# Patient Record
Sex: Female | Born: 1950 | Race: White | Hispanic: No | Marital: Married | Smoking: Never smoker
Health system: Southern US, Community
[De-identification: ages and names within clinical notes are randomized; demographics above are authoritative.]

## PROBLEM LIST (undated history)

## (undated) DIAGNOSIS — M1990 Unspecified osteoarthritis, unspecified site: Secondary | ICD-10-CM

## (undated) DIAGNOSIS — I10 Essential (primary) hypertension: Secondary | ICD-10-CM

---

## 2003-09-04 ENCOUNTER — Other Ambulatory Visit: Payer: Self-pay

## 2005-07-14 ENCOUNTER — Ambulatory Visit: Payer: Self-pay | Admitting: Family Medicine

## 2008-05-08 ENCOUNTER — Ambulatory Visit: Payer: Self-pay | Admitting: Family Medicine

## 2009-06-28 ENCOUNTER — Ambulatory Visit: Payer: Self-pay

## 2010-10-18 ENCOUNTER — Ambulatory Visit: Payer: Self-pay | Admitting: Family Medicine

## 2010-10-19 ENCOUNTER — Ambulatory Visit: Payer: Self-pay | Admitting: Obstetrics and Gynecology

## 2011-01-12 ENCOUNTER — Ambulatory Visit: Payer: Self-pay

## 2012-04-03 ENCOUNTER — Ambulatory Visit: Payer: Self-pay | Admitting: Obstetrics and Gynecology

## 2014-06-18 ENCOUNTER — Ambulatory Visit: Payer: Self-pay | Admitting: Family Medicine

## 2014-06-23 ENCOUNTER — Ambulatory Visit: Payer: Self-pay | Admitting: Family Medicine

## 2016-08-29 ENCOUNTER — Other Ambulatory Visit: Payer: Self-pay | Admitting: Obstetrics and Gynecology

## 2016-08-29 DIAGNOSIS — Z13820 Encounter for screening for osteoporosis: Secondary | ICD-10-CM

## 2016-08-29 DIAGNOSIS — Z1231 Encounter for screening mammogram for malignant neoplasm of breast: Secondary | ICD-10-CM | POA: Diagnosis not present

## 2016-08-29 DIAGNOSIS — Z6836 Body mass index (BMI) 36.0-36.9, adult: Secondary | ICD-10-CM | POA: Diagnosis not present

## 2016-08-29 DIAGNOSIS — Z01419 Encounter for gynecological examination (general) (routine) without abnormal findings: Secondary | ICD-10-CM | POA: Diagnosis not present

## 2016-08-29 DIAGNOSIS — Z1239 Encounter for other screening for malignant neoplasm of breast: Secondary | ICD-10-CM

## 2016-09-13 ENCOUNTER — Ambulatory Visit
Admission: RE | Admit: 2016-09-13 | Discharge: 2016-09-13 | Disposition: A | Discharge status: Home / Self Care | Payer: PPO | Source: Ambulatory Visit | Attending: Obstetrics and Gynecology | Admitting: Obstetrics and Gynecology

## 2016-09-13 ENCOUNTER — Encounter: Payer: Self-pay | Admitting: Radiology

## 2016-09-13 DIAGNOSIS — M85852 Other specified disorders of bone density and structure, left thigh: Secondary | ICD-10-CM | POA: Diagnosis not present

## 2016-09-13 DIAGNOSIS — Z1239 Encounter for other screening for malignant neoplasm of breast: Secondary | ICD-10-CM

## 2016-09-13 DIAGNOSIS — Z13820 Encounter for screening for osteoporosis: Secondary | ICD-10-CM | POA: Insufficient documentation

## 2016-09-13 DIAGNOSIS — Z1231 Encounter for screening mammogram for malignant neoplasm of breast: Secondary | ICD-10-CM | POA: Diagnosis not present

## 2016-11-01 ENCOUNTER — Other Ambulatory Visit: Payer: Self-pay

## 2016-11-01 NOTE — Patient Outreach (Signed)
[employer] Questionnaire: 10/31/2016  Placed call to patient, no answer. Unidentified voicemail. No message left. 11/01/2016  Placed 2nd call to patient. No answer. Unidentified voicemail mail. No message left.   PLAN: will attempt outreach again in 1 week. Rowe PavyAmanda Shanel Prazak, RN, BSN, CEN [facility] Coordinator [phone]

## 2016-11-08 ENCOUNTER — Other Ambulatory Visit: Payer: Self-pay

## 2016-11-08 NOTE — Patient Outreach (Signed)
[employer] Screening:  Attempted to reach patient for follow up on questionnaire: no answer.  PLAN: Will mail unsuccessful outreach letter and wait 10 days for a response. If no response will close.  Rowe PavyAmanda Jaliah Foody, RN, BSN, CEN [facility] Coordinator [phone]

## 2016-11-09 ENCOUNTER — Ambulatory Visit: Payer: Self-pay

## 2016-11-20 ENCOUNTER — Other Ambulatory Visit: Payer: Self-pay

## 2016-11-20 NOTE — Patient Outreach (Signed)
Case closure:  No response to outreach calls or letters.  Case closed.   Rowe PavyAmanda Ruthanne Mcneish, RN, BSN, CEN [facility] Coordinator [phone]

## 2016-11-22 ENCOUNTER — Ambulatory Visit: Payer: Self-pay

## 2017-01-15 DIAGNOSIS — G43909 Migraine, unspecified, not intractable, without status migrainosus: Secondary | ICD-10-CM | POA: Diagnosis not present

## 2017-01-15 DIAGNOSIS — Z23 Encounter for immunization: Secondary | ICD-10-CM | POA: Diagnosis not present

## 2017-01-15 DIAGNOSIS — Z0001 Encounter for general adult medical examination with abnormal findings: Secondary | ICD-10-CM | POA: Diagnosis not present

## 2017-01-15 DIAGNOSIS — F419 Anxiety disorder, unspecified: Secondary | ICD-10-CM | POA: Diagnosis not present

## 2017-01-15 DIAGNOSIS — N951 Menopausal and female climacteric states: Secondary | ICD-10-CM | POA: Diagnosis not present

## 2017-01-15 DIAGNOSIS — I10 Essential (primary) hypertension: Secondary | ICD-10-CM | POA: Diagnosis not present

## 2017-01-15 DIAGNOSIS — G894 Chronic pain syndrome: Secondary | ICD-10-CM | POA: Diagnosis not present

## 2017-01-15 DIAGNOSIS — Z7982 Long term (current) use of aspirin: Secondary | ICD-10-CM | POA: Diagnosis not present

## 2017-02-26 DIAGNOSIS — R05 Cough: Secondary | ICD-10-CM | POA: Diagnosis not present

## 2017-02-26 DIAGNOSIS — Z6835 Body mass index (BMI) 35.0-35.9, adult: Secondary | ICD-10-CM | POA: Diagnosis not present

## 2017-04-11 DIAGNOSIS — G43909 Migraine, unspecified, not intractable, without status migrainosus: Secondary | ICD-10-CM | POA: Diagnosis not present

## 2017-04-11 DIAGNOSIS — I10 Essential (primary) hypertension: Secondary | ICD-10-CM | POA: Diagnosis not present

## 2017-04-11 DIAGNOSIS — Z79899 Other long term (current) drug therapy: Secondary | ICD-10-CM | POA: Diagnosis not present

## 2017-04-11 DIAGNOSIS — M67441 Ganglion, right hand: Secondary | ICD-10-CM | POA: Diagnosis not present

## 2017-04-11 DIAGNOSIS — G894 Chronic pain syndrome: Secondary | ICD-10-CM | POA: Diagnosis not present

## 2017-07-11 DIAGNOSIS — I10 Essential (primary) hypertension: Secondary | ICD-10-CM | POA: Diagnosis not present

## 2017-07-11 DIAGNOSIS — G894 Chronic pain syndrome: Secondary | ICD-10-CM | POA: Diagnosis not present

## 2017-10-18 DIAGNOSIS — G894 Chronic pain syndrome: Secondary | ICD-10-CM | POA: Diagnosis not present

## 2017-10-18 DIAGNOSIS — G43909 Migraine, unspecified, not intractable, without status migrainosus: Secondary | ICD-10-CM | POA: Diagnosis not present

## 2017-10-18 DIAGNOSIS — Z6834 Body mass index (BMI) 34.0-34.9, adult: Secondary | ICD-10-CM | POA: Diagnosis not present

## 2017-10-18 DIAGNOSIS — I10 Essential (primary) hypertension: Secondary | ICD-10-CM | POA: Diagnosis not present

## 2017-10-18 DIAGNOSIS — Z79891 Long term (current) use of opiate analgesic: Secondary | ICD-10-CM | POA: Diagnosis not present

## 2017-10-18 DIAGNOSIS — Z79899 Other long term (current) drug therapy: Secondary | ICD-10-CM | POA: Diagnosis not present

## 2017-10-18 DIAGNOSIS — E876 Hypokalemia: Secondary | ICD-10-CM | POA: Diagnosis not present

## 2017-12-24 ENCOUNTER — Other Ambulatory Visit: Payer: Self-pay

## 2017-12-24 ENCOUNTER — Ambulatory Visit
Admission: EM | Admit: 2017-12-24 | Discharge: 2017-12-24 | Disposition: A | Discharge status: Home / Self Care | Payer: PPO | Attending: Family Medicine | Admitting: Family Medicine

## 2017-12-24 ENCOUNTER — Encounter: Payer: Self-pay | Admitting: Emergency Medicine

## 2017-12-24 DIAGNOSIS — T464X5A Adverse effect of angiotensin-converting-enzyme inhibitors, initial encounter: Secondary | ICD-10-CM

## 2017-12-24 DIAGNOSIS — T783XXA Angioneurotic edema, initial encounter: Secondary | ICD-10-CM

## 2017-12-24 DIAGNOSIS — R220 Localized swelling, mass and lump, head: Secondary | ICD-10-CM | POA: Diagnosis not present

## 2017-12-24 DIAGNOSIS — Z79899 Other long term (current) drug therapy: Secondary | ICD-10-CM | POA: Diagnosis not present

## 2017-12-24 DIAGNOSIS — J029 Acute pharyngitis, unspecified: Secondary | ICD-10-CM | POA: Diagnosis not present

## 2017-12-24 DIAGNOSIS — I10 Essential (primary) hypertension: Secondary | ICD-10-CM | POA: Diagnosis not present

## 2017-12-24 HISTORY — DX: Essential (primary) hypertension: I10

## 2017-12-24 HISTORY — DX: Unspecified osteoarthritis, unspecified site: M19.90

## 2017-12-24 LAB — RAPID STREP SCREEN (MED CTR MEBANE ONLY): Streptococcus, Group A Screen (Direct): NEGATIVE

## 2017-12-24 NOTE — ED Provider Notes (Addendum)
[facility] ____________________________________________  Time seen: Approximately 3:50 PM  I have reviewed the triage vital signs and the nursing notes.   HISTORY  Chief Complaint Sore Throat   HPI Marissa Ramirez is a 67 y.o. female presenting for evaluation of what she initially describes as a sore throat.  Patient further describes sore throat as throat swelling sensation only on the right side.  States this started approximately 2 hours prior to arrival.  Denies any accompanying cough, congestion, fevers, rash.  Able to swallow normally per patient.  Denies lip or tongue swelling or facial swelling.  Patient however then reports last Monday significant swelling to upper lip but thought it was due to a new make-up.  However then she had a similar on Thursday that was only to her lower lip but not as bad.  States that she has not use the make-up again.  Denies any changes in foods, medicines, lotions, detergents or other contacts.  States otherwise she feels completely fine.  Denies chest pain, shortness of breath, skin changes or other swelling sensation.  Denies any eating just prior to symptom onset.  Denies aggravating or alleviating factors for today.  Did take Benadryl last Monday.  Dortha Kern, MD: PCP   Past Medical History:  Diagnosis Date  . Arthritis   . Hypertension     There are no active problems to display for this patient.   History reviewed. No pertinent surgical history.   No current facility-administered medications for this encounter.   Current Outpatient Medications:  .  amLODipine (NORVASC) 5 MG tablet, Take 5 mg by mouth daily., Disp: , Rfl: 3 .  aspirin 81 MG chewable tablet, Baby Aspirin, Disp: , Rfl:  .  celecoxib (CELEBREX) 200 MG capsule, Celebrex 200 mg capsule   1 capsule every day by oral route., Disp: , Rfl:  .  chlorthalidone (HYGROTON) 25 MG tablet, chlorthalidone 25 mg tablet   1 tablet every day by oral route., Disp: , Rfl:    .  cholecalciferol (VITAMIN D) 1000 units tablet, Vitamin D, Disp: , Rfl:  .  citalopram (CELEXA) 20 MG tablet, citalopram 20 mg tablet   1 tablet every day by oral route., Disp: , Rfl:  .  estrogen, conjugated,-medroxyprogesterone (PREMPRO) 0.3-1.5 MG tablet, Prempro 0.3 mg-1.5 mg tablet  Take 1 tablet every day by oral route., Disp: , Rfl:  .  metoprolol tartrate (LOPRESSOR) 25 MG tablet, metoprolol tartrate 25 mg tablet   1 tablet every day by oral route., Disp: , Rfl:  .  quinapril (ACCUPRIL) 40 MG tablet, Take 40 mg by mouth daily., Disp: , Rfl: 3 .  traMADol (ULTRAM) 50 MG tablet, tramadol 50 mg tablet   2 tablets every day by oral route., Disp: , Rfl:   Allergies Patient has no known allergies.  Family History  Problem Relation Age of Onset  . Breast cancer Maternal Aunt 70  . Hypertension Father     Social History Social History   Tobacco Use  . Smoking status: Never Smoker  . Smokeless tobacco: Never Used  Substance Use Topics  . Alcohol use: Not Currently  . Drug use: Never    Review of Systems Constitutional: No fever/chills Eyes: No visual changes. ENT: As above.  Cardiovascular: Denies chest pain. Respiratory: Denies shortness of breath. Gastrointestinal: No abdominal pain.  Skin: Negative for rash. Neurological: Negative for headaches, focal weakness or numbness.    ____________________________________________   PHYSICAL EXAM:  VITAL SIGNS: ED Triage Vitals [  12/24/17 1511]  Enc Vitals Group     BP (!) 135/97     Pulse Rate 85     Resp 16     Temp 98.5 F (36.9 C)     Temp Source Oral     SpO2 97 %     Weight 210 lb (95.3 kg)     Height 5\' 5"  (1.651 m)     Head Circumference      Peak Flow      Pain Score 5     Pain Loc      Pain Edu?      Excl. in GC?     Constitutional: Alert and oriented. Well appearing and in no acute distress. Eyes: Conjunctivae are normal.  ENT      Head: Normocephalic and atraumatic.  No facial edema noted.       Ears: Nontender, no erythema, normal TMs bilaterally.      Nose: No congestion/rhinnorhea.      Mouth/Throat: Mucous membranes are moist.Oropharynx non-erythematous.  Right posterior soft palate and pharyngeal mild unilateral edema noted, no erythema, no lesions.  No edema to lip or tongue. Neck: No stridor. Supple without meningismus.  Hematological/Lymphatic/Immunilogical: No cervical lymphadenopathy. Cardiovascular: Normal rate, regular rhythm. Grossly normal heart sounds.  Good peripheral circulation. Respiratory: Normal respiratory effort without tachypnea nor retractions. Breath sounds are clear and equal bilaterally. No wheezes, rales, rhonchi. Musculoskeletal: Steady gait.  No extremity edema noted. Neurologic:  Normal speech and language. Speech is normal. No gait instability.  Skin:  Skin is warm, dry and intact. No rash noted. Psychiatric: Mood and affect are normal. Speech and behavior are normal. Patient exhibits appropriate insight and judgment     Above picture from patient of last Mondays initial lip swelling. ___________________________________________   LABS (all labs ordered are listed, but only abnormal results are displayed)  Labs Reviewed  RAPID STREP SCREEN ([facility] ONLY)  CULTURE, GROUP A STREP [facility])    PROCEDURES Procedures    INITIAL IMPRESSION / ASSESSMENT AND PLAN / ED COURSE  Pertinent labs & imaging results that were available during my care of the patient were reviewed by me and considered in my medical decision making (see chart for details).  Well-appearing patient.  No acute distress.  Quick strep negative, will culture.  Presented for evaluation of throat swelling sensation.  Mild posterior edema noted.  No changes in foods or medicines.  Patient does currently take ACE inhibitor and she did take this morning.  Suspect angioedema from ACE inhibitor.  Patient denies any worsening of complaints since onset.  Due to location of edema,  recommend further evaluation and observation in emergency room at this time.  Patient agreed to this plan and states that she will have her husband take her to [facility]  Discussed no longer taking ACE inhibitor and to consider allergy.  Discussed strict follow-up and return parameters.  Patient agrees with this plan.  Discussed patient plan of care with Dr. Adriana Simas who also saw patient agrees with plan. ____________________________________________   FINAL CLINICAL IMPRESSION(S) / ED DIAGNOSES  Final diagnoses:  Angioedema, initial encounter  Angioedema due to angiotensin converting enzyme inhibitor (ACE-I)     ED Discharge Orders    None       Note: This dictation was prepared with Dragon dictation along with smaller phrase technology. Any transcriptional errors that result from this process are unintentional.         Renford Dills, NP 12/24/17 1756  Renford Dills, NP 12/24/17 1757

## 2017-12-24 NOTE — Discharge Instructions (Signed)
STOP ACE inhibitor quinapril.   Go directly to emergency room as discussed.

## 2017-12-24 NOTE — ED Triage Notes (Signed)
Patient c/o sore throat that started this morning. She stated she feels like something is stuck in her throat.

## 2017-12-27 LAB — CULTURE, GROUP A STREP (THRC)

## 2018-01-10 DIAGNOSIS — I10 Essential (primary) hypertension: Secondary | ICD-10-CM | POA: Diagnosis not present

## 2018-01-10 DIAGNOSIS — F419 Anxiety disorder, unspecified: Secondary | ICD-10-CM | POA: Diagnosis not present

## 2018-01-10 DIAGNOSIS — T783XXA Angioneurotic edema, initial encounter: Secondary | ICD-10-CM | POA: Diagnosis not present

## 2018-01-10 DIAGNOSIS — Z1389 Encounter for screening for other disorder: Secondary | ICD-10-CM | POA: Diagnosis not present

## 2018-01-10 DIAGNOSIS — Z6834 Body mass index (BMI) 34.0-34.9, adult: Secondary | ICD-10-CM | POA: Diagnosis not present

## 2018-01-10 DIAGNOSIS — Z1331 Encounter for screening for depression: Secondary | ICD-10-CM | POA: Diagnosis not present

## 2018-01-10 DIAGNOSIS — G894 Chronic pain syndrome: Secondary | ICD-10-CM | POA: Diagnosis not present

## 2018-01-14 DIAGNOSIS — Z6834 Body mass index (BMI) 34.0-34.9, adult: Secondary | ICD-10-CM | POA: Diagnosis not present

## 2018-01-14 DIAGNOSIS — I10 Essential (primary) hypertension: Secondary | ICD-10-CM | POA: Diagnosis not present

## 2018-01-14 DIAGNOSIS — J069 Acute upper respiratory infection, unspecified: Secondary | ICD-10-CM | POA: Diagnosis not present

## 2018-01-17 DIAGNOSIS — Z6834 Body mass index (BMI) 34.0-34.9, adult: Secondary | ICD-10-CM | POA: Diagnosis not present

## 2018-01-17 DIAGNOSIS — R05 Cough: Secondary | ICD-10-CM | POA: Diagnosis not present

## 2018-04-04 DIAGNOSIS — F419 Anxiety disorder, unspecified: Secondary | ICD-10-CM | POA: Diagnosis not present

## 2018-04-04 DIAGNOSIS — Z6834 Body mass index (BMI) 34.0-34.9, adult: Secondary | ICD-10-CM | POA: Diagnosis not present

## 2018-04-04 DIAGNOSIS — G894 Chronic pain syndrome: Secondary | ICD-10-CM | POA: Diagnosis not present

## 2018-04-04 DIAGNOSIS — Z0001 Encounter for general adult medical examination with abnormal findings: Secondary | ICD-10-CM | POA: Diagnosis not present

## 2018-04-04 DIAGNOSIS — Z791 Long term (current) use of non-steroidal anti-inflammatories (NSAID): Secondary | ICD-10-CM | POA: Diagnosis not present

## 2018-04-04 DIAGNOSIS — L209 Atopic dermatitis, unspecified: Secondary | ICD-10-CM | POA: Diagnosis not present

## 2018-04-04 DIAGNOSIS — Z13220 Encounter for screening for lipoid disorders: Secondary | ICD-10-CM | POA: Diagnosis not present

## 2018-04-04 DIAGNOSIS — I10 Essential (primary) hypertension: Secondary | ICD-10-CM | POA: Diagnosis not present

## 2018-04-04 DIAGNOSIS — Z23 Encounter for immunization: Secondary | ICD-10-CM | POA: Diagnosis not present

## 2018-04-04 DIAGNOSIS — Z7982 Long term (current) use of aspirin: Secondary | ICD-10-CM | POA: Diagnosis not present

## 2018-04-04 DIAGNOSIS — N951 Menopausal and female climacteric states: Secondary | ICD-10-CM | POA: Diagnosis not present

## 2018-06-27 DIAGNOSIS — Z6834 Body mass index (BMI) 34.0-34.9, adult: Secondary | ICD-10-CM | POA: Diagnosis not present

## 2018-06-27 DIAGNOSIS — G894 Chronic pain syndrome: Secondary | ICD-10-CM | POA: Diagnosis not present

## 2018-06-27 DIAGNOSIS — M2550 Pain in unspecified joint: Secondary | ICD-10-CM | POA: Diagnosis not present

## 2018-06-27 DIAGNOSIS — M549 Dorsalgia, unspecified: Secondary | ICD-10-CM | POA: Diagnosis not present

## 2018-10-15 DIAGNOSIS — M2550 Pain in unspecified joint: Secondary | ICD-10-CM | POA: Diagnosis not present

## 2018-10-15 DIAGNOSIS — M549 Dorsalgia, unspecified: Secondary | ICD-10-CM | POA: Diagnosis not present

## 2018-10-15 DIAGNOSIS — I10 Essential (primary) hypertension: Secondary | ICD-10-CM | POA: Diagnosis not present

## 2018-10-15 DIAGNOSIS — G894 Chronic pain syndrome: Secondary | ICD-10-CM | POA: Diagnosis not present

## 2019-02-04 DIAGNOSIS — I10 Essential (primary) hypertension: Secondary | ICD-10-CM | POA: Diagnosis not present

## 2019-02-04 DIAGNOSIS — G894 Chronic pain syndrome: Secondary | ICD-10-CM | POA: Diagnosis not present

## 2019-02-04 DIAGNOSIS — M549 Dorsalgia, unspecified: Secondary | ICD-10-CM | POA: Diagnosis not present

## 2019-02-04 DIAGNOSIS — M2550 Pain in unspecified joint: Secondary | ICD-10-CM | POA: Diagnosis not present

## 2019-04-20 ENCOUNTER — Encounter: Payer: Self-pay | Admitting: Emergency Medicine

## 2019-04-20 ENCOUNTER — Ambulatory Visit
Admission: EM | Admit: 2019-04-20 | Discharge: 2019-04-20 | Disposition: A | Discharge status: Home / Self Care | Payer: PPO | Attending: Emergency Medicine | Admitting: Emergency Medicine

## 2019-04-20 ENCOUNTER — Other Ambulatory Visit: Payer: Self-pay

## 2019-04-20 DIAGNOSIS — R059 Cough, unspecified: Secondary | ICD-10-CM

## 2019-04-20 DIAGNOSIS — Z20828 Contact with and (suspected) exposure to other viral communicable diseases: Secondary | ICD-10-CM

## 2019-04-20 DIAGNOSIS — R05 Cough: Secondary | ICD-10-CM | POA: Diagnosis not present

## 2019-04-20 DIAGNOSIS — U071 COVID-19: Secondary | ICD-10-CM | POA: Diagnosis not present

## 2019-04-20 DIAGNOSIS — Z20822 Contact with and (suspected) exposure to covid-19: Secondary | ICD-10-CM

## 2019-04-20 NOTE — Discharge Instructions (Addendum)
You were seen today to get tested for COVID-19.  You are to stay home and related from others while your Covid test is being processed.  This usually takes between 48 to 72 hours.  You will be notified of a positive result.

## 2019-04-20 NOTE — ED Provider Notes (Signed)
[facility] [place]   Name: Marissa Ramirez DOB: 1950/05/21 MRN: [id number] CSN: [id number] PCP: Dortha Kern, MD  Arrival date and time:  04/20/19 1521  Chief Complaint:  Cough   NOTE: Prior to seeing the patient today, I have reviewed the triage nursing documentation and vital signs. Clinical staff has updated patient's PMH/PSHx, current medication list, and drug allergies/intolerances to ensure comprehensive history available to assist in medical decision making.   History:   HPI: Marissa Ramirez is a 68 y.o. female who presents today with complaints of cough, chest congestion, loss of taste ,and sinus pressure.  She is also requesting a COVID-19 test.  Her symptoms started 1 week ago, and she believed it to be a normal sinus infection.  However, she is concerned because her son tested positive for COVID-19 yesterday.  She was in contact with her son approximately 1-1/2 weeks ago.  She denies any fevers, chills, additional body aches.   She does not work, her only encounters in the public is to go grocery shopping.   Past Medical History:  Diagnosis Date  . Arthritis   . Hypertension     History reviewed. No pertinent surgical history.  Family History  Problem Relation Age of Onset  . Breast cancer Maternal Aunt 70  . Hypertension Father     Social History   Tobacco Use  . Smoking status: Never Smoker  . Smokeless tobacco: Never Used  Substance Use Topics  . Alcohol use: Not Currently  . Drug use: Never    There are no problems to display for this patient.   Home Medications:    Current Meds  Medication Sig  . amLODipine (NORVASC) 5 MG tablet Take 5 mg by mouth daily.  . celecoxib (CELEBREX) 200 MG capsule Celebrex 200 mg capsule   1 capsule every day by oral route.  . chlorthalidone (HYGROTON) 25 MG tablet chlorthalidone 25 mg tablet   1 tablet every day by oral route.  . cholecalciferol (VITAMIN D) 1000 units tablet Vitamin D  .  citalopram (CELEXA) 20 MG tablet citalopram 20 mg tablet   1 tablet every day by oral route.  Marland Kitchen estrogen, conjugated,-medroxyprogesterone (PREMPRO) 0.3-1.5 MG tablet Prempro 0.3 mg-1.5 mg tablet  Take 1 tablet every day by oral route.  . metoprolol tartrate (LOPRESSOR) 25 MG tablet metoprolol tartrate 25 mg tablet   1 tablet every day by oral route.  . traMADol (ULTRAM) 50 MG tablet tramadol 50 mg tablet   2 tablets every day by oral route.    Allergies:   Patient has no known allergies.  Review of Systems (ROS): Review of Systems  Constitutional: Negative for chills, fatigue and fever.  HENT: Positive for congestion and sinus pressure. Negative for sore throat and trouble swallowing.        Loss of taste  Respiratory: Positive for cough.   Gastrointestinal: Negative for nausea.  Musculoskeletal: Negative for myalgias.     Vital Signs: Today's Vitals   04/20/19 1533 04/20/19 1537 04/20/19 1603  BP:  (!) 157/84   Pulse:  69   Resp:  16   Temp:  98.6 F (37 C)   TempSrc:  Oral   SpO2:  96%   Weight: 220 lb (99.8 kg)    Height: 5\' 5"  (1.651 m)    PainSc: 0-No pain  0-No pain    Physical Exam: Physical Exam Vitals and nursing note reviewed.  Constitutional:      General:  She is not in acute distress.    Appearance: She is well-developed.  HENT:     Head: Normocephalic and atraumatic.  Eyes:     Conjunctiva/sclera: Conjunctivae normal.  Cardiovascular:     Rate and Rhythm: Normal rate and regular rhythm.     Heart sounds: Murmur present.  Pulmonary:     Effort: Pulmonary effort is normal. No respiratory distress.     Breath sounds: Normal breath sounds.  Musculoskeletal:     Cervical back: Neck supple.  Skin:    General: Skin is warm and dry.  Neurological:     Mental Status: She is alert.      Urgent Care Treatments / Results:   LABS: PLEASE NOTE: all labs that were ordered this encounter are listed, however only abnormal results are displayed. Labs  Reviewed  NOVEL CORONAVIRUS, NAA (HOSP ORDER, SEND-OUT TO REF LAB; TAT 18-24 HRS)    EKG: -None  RADIOLOGY: No results found.  PROCEDURES: Procedures  MEDICATIONS RECEIVED THIS VISIT: Medications - No data to display  PERTINENT CLINICAL COURSE NOTES/UPDATES:   Initial Impression / Assessment and Plan / Urgent Care Course:  Pertinent labs & imaging results that were available during my care of the patient were personally reviewed by me and considered in my medical decision making (see lab/imaging section of note for values and interpretations).  Marissa Ramirez is a 68 y.o. female who presents to [facility] today with complaints of sinus congestion and cough, diagnosed with cough exposure to COVID-19, and treated as such with the directions below. NP and patient reviewed discharge instructions below during visit.   Patient is well appearing overall in clinic today. She does not appear to be in any acute distress. Presenting symptoms (see HPI) and exam as documented above.   I have reviewed the follow up and strict return precautions for any new or worsening symptoms. Patient is aware of symptoms that would be deemed urgent/emergent, and would thus require further evaluation either here or in the emergency department. At the time of discharge, she verbalized understanding and consent with the discharge plan as it was reviewed with her. All questions were fielded by provider and/or clinic staff prior to patient discharge.    Final Clinical Impressions / Urgent Care Diagnoses:   Final diagnoses:  Cough  Exposure to COVID-19 virus    New Prescriptions:  [place] Controlled Substance Registry consulted? Not Applicable  No orders of the defined types were placed in this encounter.     Discharge Instructions     You were seen today to get tested for COVID-19.  You are to stay home and related from others while your Covid test is being processed.  This usually takes between 48  to 72 hours.  You will be notified of a positive result.    Recommended Follow up Care:  Patient encouraged to follow up with the following provider within the specified time frame, or sooner as dictated by the severity of her symptoms. As always, she was instructed that for any urgent/emergent care needs, she should seek care either here or in the emergency department for more immediate evaluation.   Gertie Baron, DNP, NP-c    Gertie Baron, NP 04/20/19 [phone]

## 2019-04-20 NOTE — ED Triage Notes (Signed)
Patient c/o cough, chest congestion, and sinus congestion for a week.  Patient states that she had loss of taste yesterday.  Patient denies fevers.  Patient states that her son tested positive for COVID yesterday.

## 2019-04-22 ENCOUNTER — Telehealth: Payer: Self-pay | Admitting: Emergency Medicine

## 2019-04-22 NOTE — Telephone Encounter (Signed)
Patient called regarding her COVID results.  Your test for COVID-19 was positive, meaning that you were infected with the novel coronavirus and could give the germ to others. Please continue isolation at home, for at least 10 days since the start of your fever/cough/breathlessness and until you have had 3 consecutive days without fever (without taking a fever reducer) and with cough/breathlessness improving. Please continue good preventive care measures, including: frequent hand-washing, avoid touching your face, cover coughs/sneezes, stay out of crowds and keep a 6 foot distance from others. Recheck or go to the nearest hospital ED tent for re-assessment if fever/cough/breathlessness return.   Patient contacted and made aware of all results, all questions answered.

## 2019-05-05 LAB — NOVEL CORONAVIRUS, NAA (HOSP ORDER, SEND-OUT TO REF LAB; TAT 18-24 HRS): SARS-CoV-2, NAA: DETECTED — AB

## 2019-05-14 DIAGNOSIS — N951 Menopausal and female climacteric states: Secondary | ICD-10-CM | POA: Diagnosis not present

## 2019-05-14 DIAGNOSIS — I10 Essential (primary) hypertension: Secondary | ICD-10-CM | POA: Diagnosis not present

## 2019-05-14 DIAGNOSIS — G894 Chronic pain syndrome: Secondary | ICD-10-CM | POA: Diagnosis not present

## 2019-05-14 DIAGNOSIS — Z6837 Body mass index (BMI) 37.0-37.9, adult: Secondary | ICD-10-CM | POA: Diagnosis not present

## 2019-05-14 DIAGNOSIS — Z79891 Long term (current) use of opiate analgesic: Secondary | ICD-10-CM | POA: Diagnosis not present

## 2019-05-14 DIAGNOSIS — M2550 Pain in unspecified joint: Secondary | ICD-10-CM | POA: Diagnosis not present

## 2019-05-14 DIAGNOSIS — G43909 Migraine, unspecified, not intractable, without status migrainosus: Secondary | ICD-10-CM | POA: Diagnosis not present

## 2019-05-14 DIAGNOSIS — F419 Anxiety disorder, unspecified: Secondary | ICD-10-CM | POA: Diagnosis not present

## 2019-05-14 DIAGNOSIS — R7301 Impaired fasting glucose: Secondary | ICD-10-CM | POA: Diagnosis not present

## 2019-05-14 DIAGNOSIS — Z0001 Encounter for general adult medical examination with abnormal findings: Secondary | ICD-10-CM | POA: Diagnosis not present

## 2019-05-14 DIAGNOSIS — Z7982 Long term (current) use of aspirin: Secondary | ICD-10-CM | POA: Diagnosis not present

## 2019-08-11 DIAGNOSIS — G894 Chronic pain syndrome: Secondary | ICD-10-CM | POA: Diagnosis not present

## 2019-08-11 DIAGNOSIS — Z6837 Body mass index (BMI) 37.0-37.9, adult: Secondary | ICD-10-CM | POA: Diagnosis not present

## 2019-08-11 DIAGNOSIS — M2550 Pain in unspecified joint: Secondary | ICD-10-CM | POA: Diagnosis not present

## 2019-08-11 DIAGNOSIS — I10 Essential (primary) hypertension: Secondary | ICD-10-CM | POA: Diagnosis not present

## 2019-09-04 DIAGNOSIS — M79605 Pain in left leg: Secondary | ICD-10-CM | POA: Diagnosis not present

## 2019-09-04 DIAGNOSIS — M79602 Pain in left arm: Secondary | ICD-10-CM | POA: Diagnosis not present

## 2019-09-15 ENCOUNTER — Other Ambulatory Visit: Payer: Self-pay | Admitting: Family Medicine

## 2019-09-15 DIAGNOSIS — Z1231 Encounter for screening mammogram for malignant neoplasm of breast: Secondary | ICD-10-CM

## 2019-09-24 ENCOUNTER — Ambulatory Visit
Admission: RE | Admit: 2019-09-24 | Discharge: 2019-09-24 | Disposition: A | Discharge status: Home / Self Care | Payer: PPO | Source: Ambulatory Visit | Attending: Family Medicine | Admitting: Family Medicine

## 2019-09-24 ENCOUNTER — Other Ambulatory Visit: Payer: Self-pay

## 2019-09-24 ENCOUNTER — Encounter (INDEPENDENT_AMBULATORY_CARE_PROVIDER_SITE_OTHER): Payer: Self-pay

## 2019-09-24 DIAGNOSIS — Z1231 Encounter for screening mammogram for malignant neoplasm of breast: Secondary | ICD-10-CM | POA: Insufficient documentation

## 2020-01-21 DIAGNOSIS — M2550 Pain in unspecified joint: Secondary | ICD-10-CM | POA: Diagnosis not present

## 2020-01-21 DIAGNOSIS — G894 Chronic pain syndrome: Secondary | ICD-10-CM | POA: Diagnosis not present

## 2020-01-21 DIAGNOSIS — I10 Essential (primary) hypertension: Secondary | ICD-10-CM | POA: Diagnosis not present

## 2020-01-21 DIAGNOSIS — F321 Major depressive disorder, single episode, moderate: Secondary | ICD-10-CM | POA: Diagnosis not present

## 2020-05-25 DIAGNOSIS — F419 Anxiety disorder, unspecified: Secondary | ICD-10-CM | POA: Diagnosis not present

## 2020-05-25 DIAGNOSIS — I10 Essential (primary) hypertension: Secondary | ICD-10-CM | POA: Diagnosis not present

## 2020-05-25 DIAGNOSIS — Z6837 Body mass index (BMI) 37.0-37.9, adult: Secondary | ICD-10-CM | POA: Diagnosis not present

## 2020-05-25 DIAGNOSIS — Z79899 Other long term (current) drug therapy: Secondary | ICD-10-CM | POA: Diagnosis not present

## 2020-05-25 DIAGNOSIS — N951 Menopausal and female climacteric states: Secondary | ICD-10-CM | POA: Diagnosis not present

## 2020-05-25 DIAGNOSIS — G894 Chronic pain syndrome: Secondary | ICD-10-CM | POA: Diagnosis not present

## 2020-05-25 DIAGNOSIS — Z23 Encounter for immunization: Secondary | ICD-10-CM | POA: Diagnosis not present

## 2020-05-25 DIAGNOSIS — Z7982 Long term (current) use of aspirin: Secondary | ICD-10-CM | POA: Diagnosis not present

## 2020-05-25 DIAGNOSIS — Z0001 Encounter for general adult medical examination with abnormal findings: Secondary | ICD-10-CM | POA: Diagnosis not present

## 2020-05-25 DIAGNOSIS — G43909 Migraine, unspecified, not intractable, without status migrainosus: Secondary | ICD-10-CM | POA: Diagnosis not present

## 2020-05-25 DIAGNOSIS — N393 Stress incontinence (female) (male): Secondary | ICD-10-CM | POA: Diagnosis not present

## 2020-05-25 DIAGNOSIS — R7301 Impaired fasting glucose: Secondary | ICD-10-CM | POA: Diagnosis not present

## 2020-05-25 DIAGNOSIS — Z79891 Long term (current) use of opiate analgesic: Secondary | ICD-10-CM | POA: Diagnosis not present

## 2020-07-06 DIAGNOSIS — K115 Sialolithiasis: Secondary | ICD-10-CM | POA: Diagnosis not present

## 2020-07-12 DIAGNOSIS — K115 Sialolithiasis: Secondary | ICD-10-CM | POA: Diagnosis not present

## 2020-08-04 ENCOUNTER — Other Ambulatory Visit: Payer: Self-pay | Admitting: Family Medicine

## 2020-08-04 DIAGNOSIS — Z1231 Encounter for screening mammogram for malignant neoplasm of breast: Secondary | ICD-10-CM

## 2020-08-18 DIAGNOSIS — R040 Epistaxis: Secondary | ICD-10-CM | POA: Diagnosis not present

## 2020-08-18 DIAGNOSIS — G894 Chronic pain syndrome: Secondary | ICD-10-CM | POA: Diagnosis not present

## 2020-08-18 DIAGNOSIS — Z6835 Body mass index (BMI) 35.0-35.9, adult: Secondary | ICD-10-CM | POA: Diagnosis not present

## 2020-08-18 DIAGNOSIS — N951 Menopausal and female climacteric states: Secondary | ICD-10-CM | POA: Diagnosis not present

## 2020-08-18 DIAGNOSIS — N393 Stress incontinence (female) (male): Secondary | ICD-10-CM | POA: Diagnosis not present

## 2020-09-27 ENCOUNTER — Ambulatory Visit
Admission: RE | Admit: 2020-09-27 | Discharge: 2020-09-27 | Disposition: A | Discharge status: Home / Self Care | Payer: PPO | Source: Ambulatory Visit | Attending: Family Medicine | Admitting: Family Medicine

## 2020-09-27 ENCOUNTER — Other Ambulatory Visit: Payer: Self-pay

## 2020-09-27 DIAGNOSIS — Z1231 Encounter for screening mammogram for malignant neoplasm of breast: Secondary | ICD-10-CM | POA: Diagnosis not present

## 2020-10-01 DIAGNOSIS — H2513 Age-related nuclear cataract, bilateral: Secondary | ICD-10-CM | POA: Diagnosis not present

## 2020-11-10 DIAGNOSIS — F321 Major depressive disorder, single episode, moderate: Secondary | ICD-10-CM | POA: Diagnosis not present

## 2020-11-10 DIAGNOSIS — G894 Chronic pain syndrome: Secondary | ICD-10-CM | POA: Diagnosis not present

## 2020-11-10 DIAGNOSIS — R7309 Other abnormal glucose: Secondary | ICD-10-CM | POA: Diagnosis not present

## 2020-11-10 DIAGNOSIS — K5909 Other constipation: Secondary | ICD-10-CM | POA: Diagnosis not present

## 2020-11-10 DIAGNOSIS — F411 Generalized anxiety disorder: Secondary | ICD-10-CM | POA: Diagnosis not present

## 2020-11-10 DIAGNOSIS — I10 Essential (primary) hypertension: Secondary | ICD-10-CM | POA: Diagnosis not present

## 2020-11-10 DIAGNOSIS — Z1331 Encounter for screening for depression: Secondary | ICD-10-CM | POA: Diagnosis not present

## 2020-11-10 DIAGNOSIS — Z6835 Body mass index (BMI) 35.0-35.9, adult: Secondary | ICD-10-CM | POA: Diagnosis not present

## 2020-11-10 DIAGNOSIS — Z1389 Encounter for screening for other disorder: Secondary | ICD-10-CM | POA: Diagnosis not present

## 2020-11-18 IMAGING — MG DIGITAL SCREENING BILAT W/ TOMO W/ CAD
6 of 10 series · 6 of 30 positions shown · non-contrast
Comparison: Previous exam(s).

CLINICAL DATA: Screening.

EXAM:
DIGITAL SCREENING BILATERAL MAMMOGRAM WITH TOMO AND CAD

[L MLO synth-2D (1 of 2)]
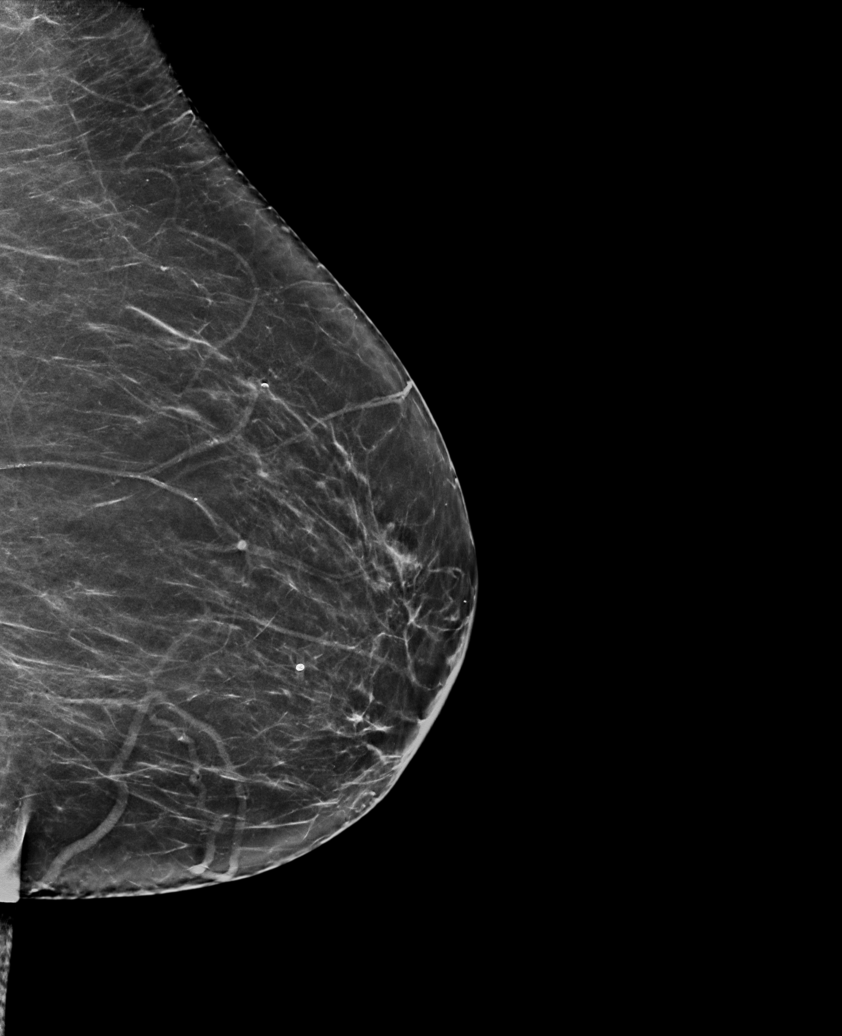

[L CC synth-2D]
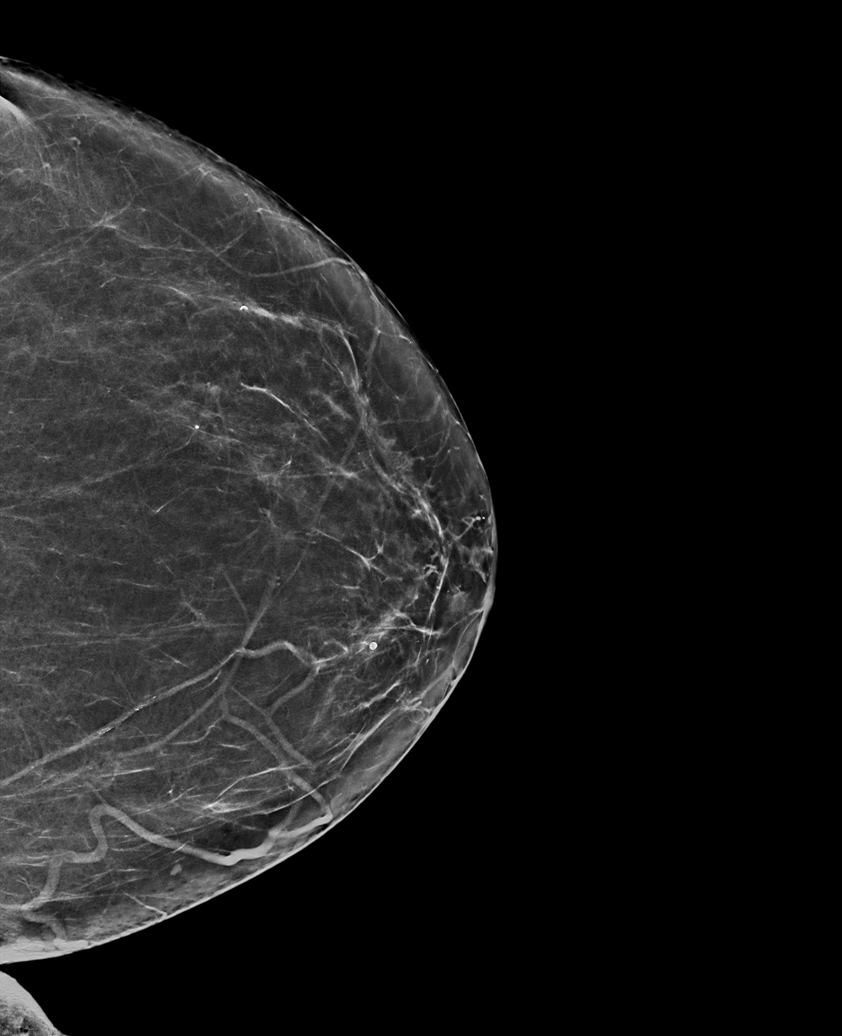

[R MLO synth-2D]
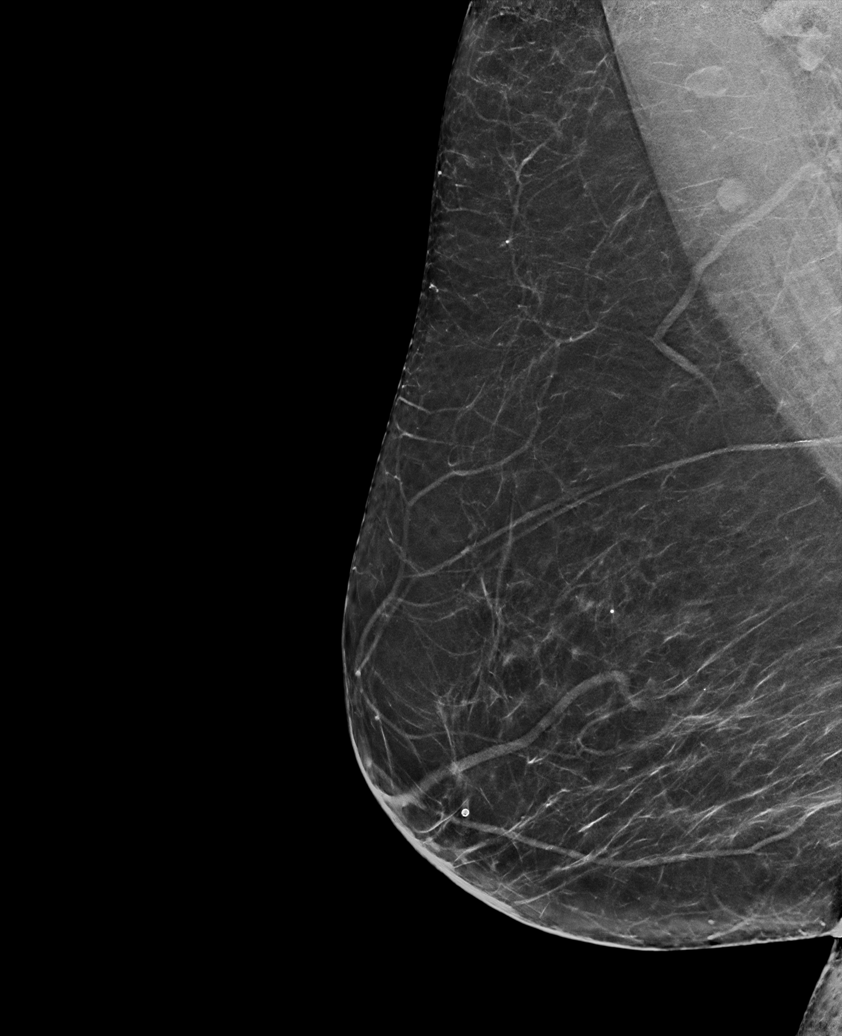

[R CC synth-2D]
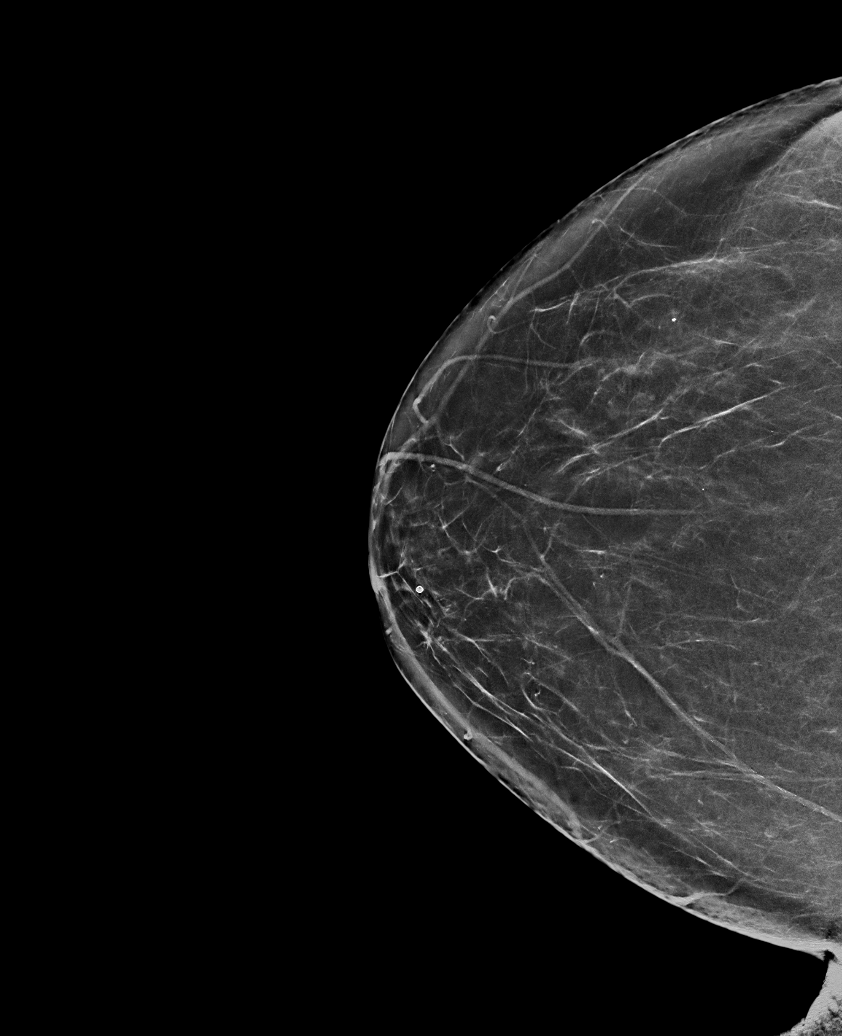

[L MLO synth-2D (2 of 2)]
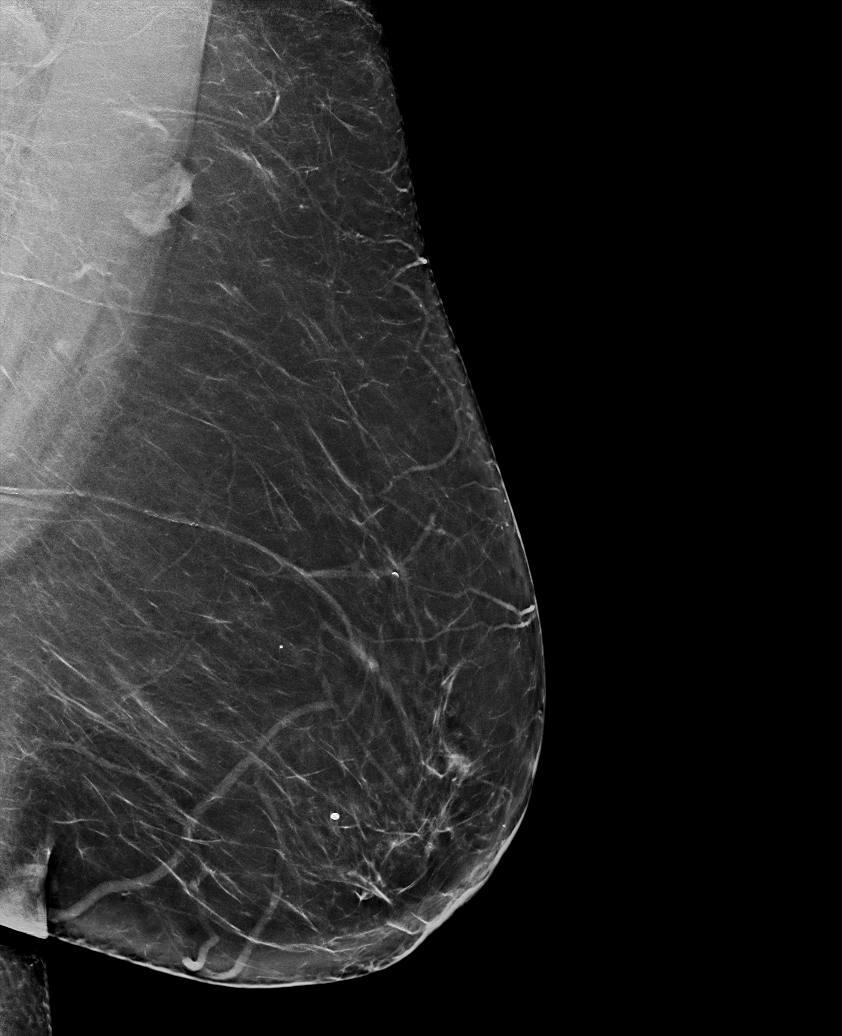

[R MLO tomo · tomo slice 38/75.0]
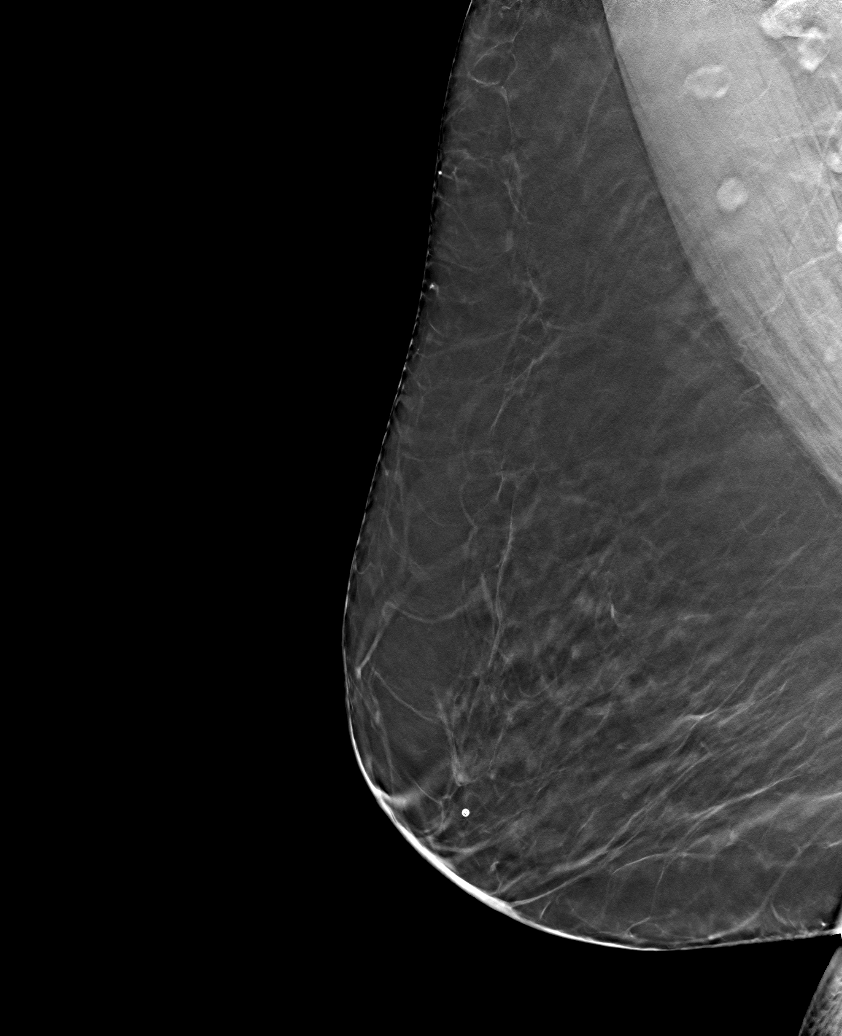

[6 of 30 positions shown; findings below may reference images not displayed]

ACR Breast Density Category b: There are scattered areas of
fibroglandular density.
FINDINGS: There are no findings suspicious for malignancy. Images were
processed with CAD.
IMPRESSION: No mammographic evidence of malignancy. A result letter of this
screening mammogram will be mailed directly to the patient.

RECOMMENDATION:
Screening mammogram in one year. (Code:[id number])

BI-RADS CATEGORY  1: Negative.

## 2021-02-15 DIAGNOSIS — Z6835 Body mass index (BMI) 35.0-35.9, adult: Secondary | ICD-10-CM | POA: Diagnosis not present

## 2021-02-15 DIAGNOSIS — Z23 Encounter for immunization: Secondary | ICD-10-CM | POA: Diagnosis not present

## 2021-02-15 DIAGNOSIS — G894 Chronic pain syndrome: Secondary | ICD-10-CM | POA: Diagnosis not present

## 2021-02-15 DIAGNOSIS — I10 Essential (primary) hypertension: Secondary | ICD-10-CM | POA: Diagnosis not present

## 2021-02-15 DIAGNOSIS — F411 Generalized anxiety disorder: Secondary | ICD-10-CM | POA: Diagnosis not present

## 2021-02-15 DIAGNOSIS — M2550 Pain in unspecified joint: Secondary | ICD-10-CM | POA: Diagnosis not present

## 2021-02-15 DIAGNOSIS — R7309 Other abnormal glucose: Secondary | ICD-10-CM | POA: Diagnosis not present

## 2021-10-17 ENCOUNTER — Other Ambulatory Visit: Payer: Self-pay | Admitting: Family Medicine

## 2021-10-17 DIAGNOSIS — Z1231 Encounter for screening mammogram for malignant neoplasm of breast: Secondary | ICD-10-CM

## 2021-11-07 ENCOUNTER — Ambulatory Visit: Payer: PPO

## 2021-11-24 ENCOUNTER — Ambulatory Visit
Admission: RE | Admit: 2021-11-24 | Discharge: 2021-11-24 | Disposition: A | Discharge status: Home / Self Care | Payer: PPO | Source: Ambulatory Visit | Attending: Family Medicine | Admitting: Family Medicine

## 2021-11-24 DIAGNOSIS — Z1231 Encounter for screening mammogram for malignant neoplasm of breast: Secondary | ICD-10-CM | POA: Insufficient documentation

## 2022-11-20 ENCOUNTER — Other Ambulatory Visit: Payer: Self-pay | Admitting: Family Medicine

## 2022-11-20 DIAGNOSIS — Z1231 Encounter for screening mammogram for malignant neoplasm of breast: Secondary | ICD-10-CM

## 2022-12-18 ENCOUNTER — Ambulatory Visit
Admission: RE | Admit: 2022-12-18 | Discharge: 2022-12-18 | Disposition: A | Discharge status: Home / Self Care | Payer: HMO | Source: Ambulatory Visit | Attending: Family Medicine | Admitting: Family Medicine

## 2022-12-18 DIAGNOSIS — Z1231 Encounter for screening mammogram for malignant neoplasm of breast: Secondary | ICD-10-CM | POA: Diagnosis present

## 2023-12-07 ENCOUNTER — Other Ambulatory Visit: Payer: Self-pay | Admitting: Family Medicine

## 2023-12-07 DIAGNOSIS — Z1231 Encounter for screening mammogram for malignant neoplasm of breast: Secondary | ICD-10-CM

## 2024-01-03 ENCOUNTER — Ambulatory Visit

## 2024-01-15 ENCOUNTER — Ambulatory Visit

## 2024-02-12 ENCOUNTER — Ambulatory Visit
Admission: RE | Admit: 2024-02-12 | Discharge: 2024-02-12 | Disposition: A | Discharge status: Home / Self Care | Source: Ambulatory Visit | Attending: Family Medicine | Admitting: Family Medicine

## 2024-02-12 DIAGNOSIS — Z1231 Encounter for screening mammogram for malignant neoplasm of breast: Secondary | ICD-10-CM | POA: Diagnosis present
